# Patient Record
Sex: Male | Born: 1995 | Race: White | Hispanic: No | Marital: Single | Smoking: Never smoker
Health system: Southern US, Community
[De-identification: ages and names within clinical notes are randomized; demographics above are authoritative.]

## PROBLEM LIST (undated history)

## (undated) DIAGNOSIS — J45909 Unspecified asthma, uncomplicated: Secondary | ICD-10-CM

## (undated) HISTORY — DX: Unspecified asthma, uncomplicated: J45.909

---
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
## 2019-07-15 ENCOUNTER — Other Ambulatory Visit: Payer: Self-pay

## 2019-07-15 ENCOUNTER — Ambulatory Visit: Payer: BC Managed Care – PPO | Attending: Internal Medicine

## 2019-07-15 DIAGNOSIS — Z20822 Contact with and (suspected) exposure to covid-19: Secondary | ICD-10-CM

## 2019-07-17 ENCOUNTER — Telehealth: Payer: Self-pay

## 2019-07-17 LAB — NOVEL CORONAVIRUS, NAA: SARS-CoV-2, NAA: NOT DETECTED

## 2019-07-17 NOTE — Telephone Encounter (Signed)
Pt. Checking on COVID 19 results, not available yet. °

## 2019-07-30 ENCOUNTER — Other Ambulatory Visit: Payer: Self-pay

## 2019-07-30 ENCOUNTER — Ambulatory Visit: Payer: BC Managed Care – PPO | Attending: Internal Medicine

## 2019-07-30 DIAGNOSIS — Z20822 Contact with and (suspected) exposure to covid-19: Secondary | ICD-10-CM

## 2019-08-01 LAB — NOVEL CORONAVIRUS, NAA: SARS-CoV-2, NAA: DETECTED — AB

## 2021-03-21 ENCOUNTER — Emergency Department (HOSPITAL_COMMUNITY)
Admission: EM | Admit: 2021-03-21 | Discharge: 2021-03-22 | Disposition: A | Discharge status: Home / Self Care | Payer: BC Managed Care – PPO | Attending: Emergency Medicine | Admitting: Emergency Medicine

## 2021-03-21 ENCOUNTER — Other Ambulatory Visit: Payer: Self-pay

## 2021-03-21 ENCOUNTER — Emergency Department (HOSPITAL_COMMUNITY): Payer: BC Managed Care – PPO

## 2021-03-21 ENCOUNTER — Encounter (HOSPITAL_COMMUNITY): Payer: Self-pay | Admitting: Emergency Medicine

## 2021-03-21 DIAGNOSIS — M542 Cervicalgia: Secondary | ICD-10-CM | POA: Diagnosis not present

## 2021-03-21 DIAGNOSIS — M79602 Pain in left arm: Secondary | ICD-10-CM | POA: Diagnosis not present

## 2021-03-21 DIAGNOSIS — Z87891 Personal history of nicotine dependence: Secondary | ICD-10-CM | POA: Insufficient documentation

## 2021-03-21 DIAGNOSIS — J45909 Unspecified asthma, uncomplicated: Secondary | ICD-10-CM | POA: Diagnosis not present

## 2021-03-21 DIAGNOSIS — R0789 Other chest pain: Secondary | ICD-10-CM

## 2021-03-21 LAB — BASIC METABOLIC PANEL
Anion gap: 9 (ref 5–15)
BUN: 17 mg/dL (ref 6–20)
CO2: 25 mmol/L (ref 22–32)
Calcium: 9.3 mg/dL (ref 8.9–10.3)
Chloride: 103 mmol/L (ref 98–111)
Creatinine, Ser: 0.89 mg/dL (ref 0.61–1.24)
GFR, Estimated: >60 mL/min (ref >60)
Glucose, Bld: 105 mg/dL — ABNORMAL HIGH (ref 70–99)
Potassium: 3.7 mmol/L (ref 3.5–5.1)
Sodium: 137 mmol/L (ref 135–145)

## 2021-03-21 LAB — CBC
HCT: 44.4 % (ref 39.0–52.0)
Hemoglobin: 16.4 g/dL (ref 13.0–17.0)
MCH: 33.5 pg (ref 26.0–34.0)
MCHC: 36.9 g/dL — ABNORMAL HIGH (ref 30.0–36.0)
MCV: 90.6 fL (ref 80.0–100.0)
Platelets: 267 10*3/uL (ref 150–400)
RBC: 4.9 MIL/uL (ref 4.22–5.81)
RDW: 11.5 % (ref 11.5–15.5)
WBC: 8 10*3/uL (ref 4.0–10.5)
nRBC: 0 % (ref 0.0–0.2)

## 2021-03-21 LAB — TROPONIN I (HIGH SENSITIVITY)
Troponin I (High Sensitivity): <2 ng/L (ref <18)
Troponin I (High Sensitivity): <2 ng/L (ref <18)

## 2021-03-21 LAB — CK: Total CK: 312 U/L (ref 49–397)

## 2021-03-21 LAB — D-DIMER, QUANTITATIVE: D-Dimer, Quant: <0.27 ug/mL-FEU (ref 0.00–0.50)

## 2021-03-21 MED ORDER — KETOROLAC TROMETHAMINE 30 MG/ML IJ SOLN
30.0000 mg | Freq: Once | INTRAMUSCULAR | Status: AC
  Administered 2021-03-21: 30 mg via INTRAMUSCULAR
  Filled 2021-03-21: qty 1

## 2021-03-21 NOTE — ED Triage Notes (Signed)
Pt c/o left chest pain that radiates down left arm for a few days.

## 2021-03-21 NOTE — ED Provider Notes (Signed)
[facility] EMERGENCY DEPARTMENT Provider Note   CSN: [id number] Arrival date & time: 03/21/21  1913     History Chief Complaint  Patient presents with   Chest Pain    Jeremiah Robinson is a 25 y.o. male.  Patient with a history of asthma presenting with a 3-day history of left-sided chest, arm and neck pain.  He denies any fall or trauma.  States the pain is fairly constant and has not tried anything for it at home.  He was initially having some chest pain but is now having more arm pain and also having a chest pain as well.  Describes pain in his chest that radiates into his left upper arm that is constant.  Does not radiate to his back.  There is no associated weakness in the arm.  No numbness or tingling.  No cough or fever.  No vomiting or diaphoresis.  Denies any cardiac history or any family history of early cardiac death.  Uses marijuana but no other illicit drugs.  Prescribed no regular medications. The pain is not exertional or pleuritic.  Is not worse with palpation or movement.  He is not certain what is causing it.  Today he is having pain mostly in his left bicep and not so much in his chest.  Denies any headache.  No focal weakness, numbness or tingling.  Describes the pain as a "ache on the inside".  The history is provided by the patient.  Chest Pain Associated symptoms: no abdominal pain, no dizziness, no fever, no headache, no nausea, no shortness of breath, no vomiting and no weakness       Past Medical History:  Diagnosis Date   Asthma    Has not needed inhaler in a few years    Patient Active Problem List   Diagnosis Date Noted   Exercise-induced asthma 08/09/2014    History reviewed. No pertinent surgical history.     No family history on file.  Social History   Tobacco Use   Smoking status: Never   Smokeless tobacco: Former    Types: Snuff  Substance Use Topics   Alcohol use: No   Drug use: Yes    Types: Marijuana    Home Medications Prior  to Admission medications   Medication Sig Start Date End Date Taking? Authorizing Provider  albuterol (PROAIR HFA) 108 (90 BASE) MCG/ACT inhaler Inhale 2 puffs into the lungs every 6 (six) hours as needed for wheezing. Patient not taking: No sig reported 08/09/14   Arnaldo Natal, MD    Allergies    Penicillins  Review of Systems   Review of Systems  Constitutional:  Negative for activity change, appetite change and fever.  HENT:  Negative for congestion and rhinorrhea.   Eyes:  Negative for visual disturbance.  Respiratory:  Positive for chest tightness. Negative for shortness of breath.   Cardiovascular:  Positive for chest pain. Negative for leg swelling.  Gastrointestinal:  Negative for abdominal pain, nausea and vomiting.  Genitourinary:  Negative for dysuria and hematuria.  Musculoskeletal:  Positive for arthralgias and myalgias.  Neurological:  Negative for dizziness, weakness and headaches.   all other systems are negative except as noted in the HPI and PMH.   Physical Exam Updated Vital Signs BP 133/81   Pulse (!) 53   Temp 98.9 F (37.2 C) (Oral)   Resp 14   Ht 5\' 11"  (1.803 m)   Wt 74.8 kg   SpO2 99%   BMI 23.01 kg/m  Physical Exam Vitals and nursing note reviewed.  Constitutional:      General: He is not in acute distress.    Appearance: He is well-developed.  HENT:     Head: Normocephalic and atraumatic.     Mouth/Throat:     Pharynx: No oropharyngeal exudate.  Eyes:     Conjunctiva/sclera: Conjunctivae normal.     Pupils: Pupils are equal, round, and reactive to light.  Neck:     Comments: No meningismus. Cardiovascular:     Rate and Rhythm: Normal rate and regular rhythm.     Heart sounds: Normal heart sounds. No murmur heard. Pulmonary:     Effort: Pulmonary effort is normal. No respiratory distress.     Breath sounds: Normal breath sounds.  Chest:     Chest wall: No tenderness.  Abdominal:     Palpations: Abdomen is soft.     Tenderness: There  is no abdominal tenderness. There is no guarding or rebound.  Musculoskeletal:        General: Tenderness present. Normal range of motion.     Cervical back: Normal range of motion and neck supple.     Comments: Tenderness to left bicep without erythema or edema. Equal grip strength bilaterally.  Full range of motion of left shoulder, elbow and wrist.  Skin:    General: Skin is warm.  Neurological:     Mental Status: He is alert and oriented to person, place, and time.     Cranial Nerves: No cranial nerve deficit.     Motor: No abnormal muscle tone.     Coordination: Coordination normal.     Comments:  5/5 strength throughout. CN 2-12 intact.Equal grip strength.   Psychiatric:        Behavior: Behavior normal.    ED Results / Procedures / Treatments   Labs (all labs ordered are listed, but only abnormal results are displayed) Labs Reviewed  BASIC METABOLIC PANEL - Abnormal; Notable for the following components:      Result Value   Glucose, Bld 105 (*)    All other components within normal limits  CBC - Abnormal; Notable for the following components:   MCHC 36.9 (*)    All other components within normal limits  D-DIMER, QUANTITATIVE  CK  TROPONIN I (HIGH SENSITIVITY)  TROPONIN I (HIGH SENSITIVITY)    EKG EKG Interpretation  Date/Time:  Tuesday March 21 2021 19:36:54 EDT Ventricular Rate:  77 PR Interval:  164 QRS Duration: 96 QT Interval:  374 QTC Calculation: 423 R Axis:   72 Text Interpretation: Normal sinus rhythm with sinus arrhythmia Normal ECG No previous ECGs available Confirmed by Glynn Octave [phone]) on 03/21/2021 10:13:23 PM  Radiology DG Chest 2 View  Result Date: 03/21/2021 CLINICAL DATA:  Chest pain EXAM: CHEST - 2 VIEW COMPARISON:  07/05/2006 FINDINGS: The heart size and mediastinal contours are within normal limits. Both lungs are clear. The visualized skeletal structures are unremarkable. IMPRESSION: No active cardiopulmonary disease.  Electronically Signed   By: Helyn Numbers M.D.   On: 03/21/2021 20:03    Procedures Procedures   Medications Ordered in ED Medications  ketorolac (TORADOL) 30 MG/ML injection 30 mg (30 mg Intramuscular Given 03/21/21 2311)    ED Course  I have reviewed the triage vital signs and the nursing notes.  Pertinent labs & imaging results that were available during my care of the patient were reviewed by me and considered in my medical decision making (see chart for details).  MDM Rules/Calculators/A&P                           3 days of left-sided chest pain and upper arm pain.  EKG is sinus rhythm without acute ischemia.  Neurovascularly intact.  Low suspicion for ACS or PE.  Troponin is negative, D-dimer is negative, CK is normal  Somewhat improved with Toradol.  Troponin negative x2.  Low suspicion for ACS or pulmonary embolism.  Mother at bedside reports patient has fractured his left clavicle in the past and had intermittent numbness and pain in his left arm but never lasting like this.  He has good strength and sensation to his arms currently.  Question possible cervical radiculopathy.  We will treat with steroids and muscle relaxers.  No weakness or numbness currently. No indication for emergent MRI.  Advise follow-up with PCP for possible MRI if symptoms persist.  Low suspicion for cardiac cause of chest pain Return to the ED if weakness in the arm, exertional chest pain, shortness of breath, nausea, vomiting, sweating, any other concerns. Final Clinical Impression(s) / ED Diagnoses Final diagnoses:  Atypical chest pain  Pain of left upper extremity    Rx / DC Orders ED Discharge Orders     None        Bettylee Feig, Jeannett Senior, MD 03/22/21 [phone]

## 2021-03-22 MED ORDER — METHOCARBAMOL 500 MG PO TABS: 500.0000 mg | ORAL_TABLET | Freq: Three times a day (TID) | ORAL | 0 refills | Status: DC | PRN

## 2021-03-22 MED ORDER — METHYLPREDNISOLONE 4 MG PO TBPK: ORAL_TABLET | ORAL | 0 refills | Status: DC

## 2021-03-22 NOTE — Discharge Instructions (Addendum)
There is no evidence of heart attack or blood clot in the lung.  Take the anti-inflammatories and muscle relaxers as prescribed.  Follow-up with your doctor for possible MRI of your neck if your symptoms persist.  Return to the ED with exertional chest pain, shortness of breath, weakness, numbness, tingling in the arm or any other concerns

## 2021-03-24 ENCOUNTER — Encounter (HOSPITAL_COMMUNITY): Payer: Self-pay | Admitting: *Deleted

## 2021-03-24 ENCOUNTER — Emergency Department (HOSPITAL_COMMUNITY)
Admission: EM | Admit: 2021-03-24 | Discharge: 2021-03-24 | Disposition: A | Discharge status: Home / Self Care | Payer: BC Managed Care – PPO | Attending: Emergency Medicine | Admitting: Emergency Medicine

## 2021-03-24 ENCOUNTER — Other Ambulatory Visit: Payer: Self-pay

## 2021-03-24 DIAGNOSIS — M549 Dorsalgia, unspecified: Secondary | ICD-10-CM | POA: Diagnosis not present

## 2021-03-24 DIAGNOSIS — R0602 Shortness of breath: Secondary | ICD-10-CM | POA: Diagnosis not present

## 2021-03-24 DIAGNOSIS — R0789 Other chest pain: Secondary | ICD-10-CM | POA: Insufficient documentation

## 2021-03-24 DIAGNOSIS — J45909 Unspecified asthma, uncomplicated: Secondary | ICD-10-CM | POA: Insufficient documentation

## 2021-03-24 LAB — TROPONIN I (HIGH SENSITIVITY): Troponin I (High Sensitivity): <2 ng/L (ref <18)

## 2021-03-24 MED ORDER — HYDROCODONE-ACETAMINOPHEN 5-325 MG PO TABS
1.0000 | ORAL_TABLET | Freq: Once | ORAL | Status: AC
Start: 2021-03-24 — End: 2021-03-24
  Administered 2021-03-24: 1 via ORAL
  Filled 2021-03-24: qty 1

## 2021-03-24 NOTE — ED Notes (Signed)
Provider in room  

## 2021-03-24 NOTE — Discharge Instructions (Addendum)

## 2021-03-24 NOTE — ED Notes (Signed)
Pt in room with dad, pt reports vague bilateral shoulder pain/back and was seen here 3 days ago for same.

## 2021-03-24 NOTE — ED Triage Notes (Signed)
Pain in shoulders radiating into back area, seen 3 days ago for same

## 2021-03-24 NOTE — ED Provider Notes (Signed)
[facility] EMERGENCY DEPARTMENT Provider Note   CSN: [id number] Arrival date & time: 03/24/21  1809     History No chief complaint on file.   Jeremiah Robinson is a 25 y.o. male.  HPI  25 year old male presents the emergency department today for evaluation of left upper back pain and chest pain.  He was seen for similar symptoms yesterday and had a negative work-up including negative troponins, D-dimer, CK, EKG and chest x-ray.  He states that he was given medications for his symptoms she has been taking with no relief.  He denies any specific exacerbating or alleviating factors.  It is not worse with certain movements or exercise.  The pain is not pleuritic.  He reports some mild associated shortness of breath.  He denies any associated nausea, vomiting, abdominal pain, coughing or fevers.  He denies any known injuries.  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 No pertinent surgical history.     No family history on file.  Social History   Tobacco Use   Smoking status: Never   Smokeless tobacco: Former    Types: Snuff  Substance Use Topics   Alcohol use: Yes   Drug use: Yes    Types: Marijuana    Home Medications Prior to Admission medications   Medication Sig Start Date End Date Taking? Authorizing Provider  methocarbamol (ROBAXIN) 500 MG tablet Take 1 tablet (500 mg total) by mouth every 8 (eight) hours as needed for muscle spasms. 03/22/21  Yes Rancour, Jeannett Senior, MD  methylPREDNISolone (MEDROL DOSEPAK) 4 MG TBPK tablet As directed 03/22/21  Yes Rancour, Jeannett Senior, MD  albuterol (PROAIR HFA) 108 (90 BASE) MCG/ACT inhaler Inhale 2 puffs into the lungs every 6 (six) hours as needed for wheezing. Patient not taking: No sig reported 08/09/14   Arnaldo Natal, MD    Allergies    Penicillins  Review of Systems    Review of Systems  Constitutional:  Negative for chills and fever.  HENT:  Negative for ear pain and sore throat.   Eyes:  Negative for pain and visual disturbance.  Respiratory:  Negative for cough and shortness of breath.   Cardiovascular:  Positive for chest pain.  Gastrointestinal:  Negative for abdominal pain, constipation, diarrhea, nausea and vomiting.  Genitourinary:  Negative for dysuria and hematuria.  Musculoskeletal:  Positive for back pain.  Skin:  Negative for color change and rash.  Neurological:  Negative for headaches.  All other systems reviewed and are negative.  Physical Exam Updated Vital Signs BP 129/80 (BP Location: Right Arm)   Pulse 62   Temp 98.8 F (37.1 C)   Resp 16   SpO2 100%   Physical Exam Vitals and nursing note reviewed.  Constitutional:      Appearance: He is well-developed.  HENT:     Head: Normocephalic and atraumatic.  Eyes:     Conjunctiva/sclera: Conjunctivae normal.  Cardiovascular:     Rate and Rhythm: Normal rate and regular rhythm.     Heart sounds: Normal heart sounds. No murmur heard. Pulmonary:     Effort: Pulmonary effort is normal. No respiratory distress.     Breath sounds: Normal breath sounds. No wheezing, rhonchi or rales.  Abdominal:     General: Bowel sounds are normal.     Palpations: Abdomen  is soft.     Tenderness: There is no abdominal tenderness.  Musculoskeletal:     Cervical back: Neck supple.     Comments: Mild TTP to the left upper back and left chest wall  Skin:    General: Skin is warm and dry.  Neurological:     Mental Status: He is alert.    ED Results / Procedures / Treatments   Labs (all labs ordered are listed, but only abnormal results are displayed) Labs Reviewed  I-STAT CHEM 8, ED  TROPONIN I (HIGH SENSITIVITY)  TROPONIN I (HIGH SENSITIVITY)    EKG EKG Interpretation  Date/Time:  Friday March 24 2021 21:05:27 EDT Ventricular Rate:  59 PR Interval:  138 QRS Duration: 98 QT  Interval:  394 QTC Calculation: 391 R Axis:   69 Text Interpretation: Sinus rhythm ST elev, probable normal early repol pattern No significant change since last tracing Confirmed by Susy Frizzle [phone]) on 03/24/2021 9:14:46 PM  Radiology No results found.  Procedures Procedures   Medications Ordered in ED Medications  HYDROcodone-acetaminophen (NORCO/VICODIN) 5-325 MG per tablet 1 tablet (1 tablet Oral Given 03/24/21 2101)    ED Course  I have reviewed the triage vital signs and the nursing notes.  Pertinent labs & imaging results that were available during my care of the patient were reviewed by me and considered in my medical decision making (see chart for details).    MDM Rules/Calculators/A&P                          25 y/o m presenting for eval of chest pain and back pain. Seen for same 2 days ago and given meds which he states are not helping   Reviewed/interpreted labs Chem 8 did not result as there was a machine error. Pt and father did not want to wait for repeat testing for restuls Trop neg  Ekg neg  W/u reassuring. Pt feels improved after meds here in the ed. He is very well appearing and I have low suspicion for an emergent etiology of symptoms that would require further w/u or admission. Have advised pcp f/u and strict return precautions. He voices understanding of the plan and reasons to return. All questions answered, pt stable for discharge  Final Clinical Impression(s) / ED Diagnoses Final diagnoses:  Atypical chest pain    Rx / DC Orders ED Discharge Orders     None        Rayne Du 03/24/21 2257    Pollyann Savoy, MD 03/24/21 2320

## 2022-03-01 IMAGING — DX DG CERVICAL SPINE COMPLETE 4+V
5 series · 5 of 5 positions shown · non-contrast
Comparison: None.

CLINICAL DATA: Left upper extremity pain

EXAM:
CERVICAL SPINE - COMPLETE 4+ VIEW

[c-spine lat]
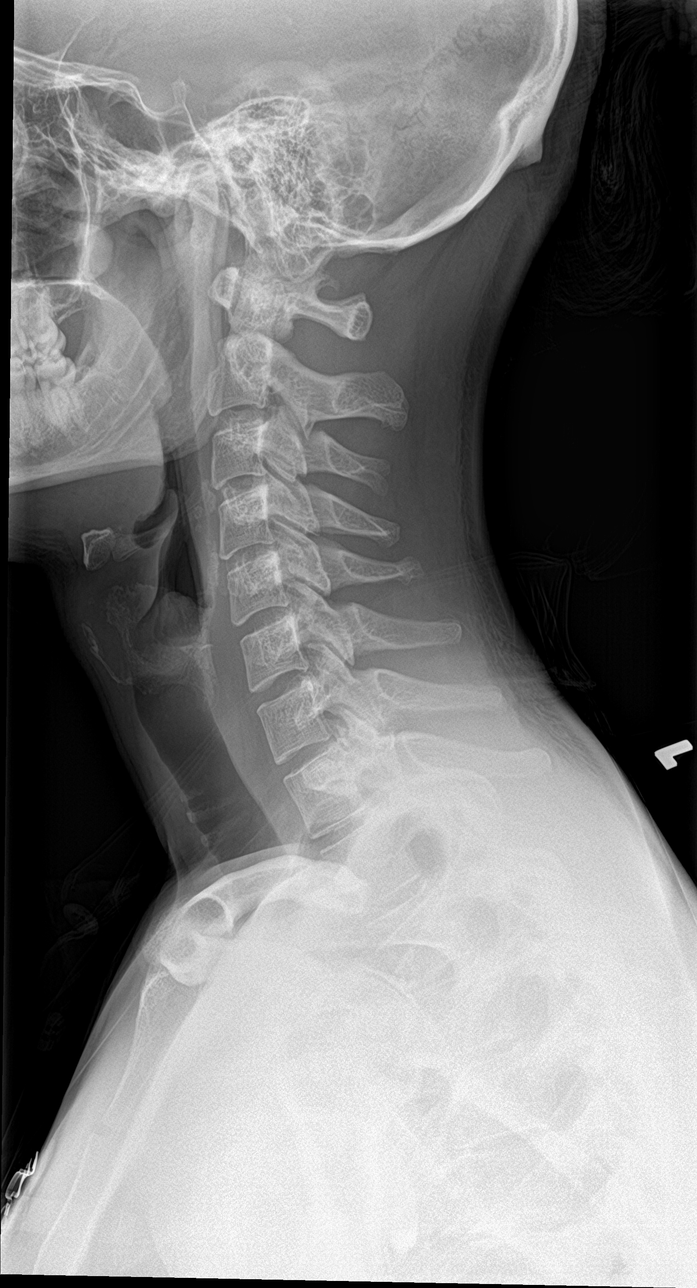

[c-spine obl (1 of 2)]
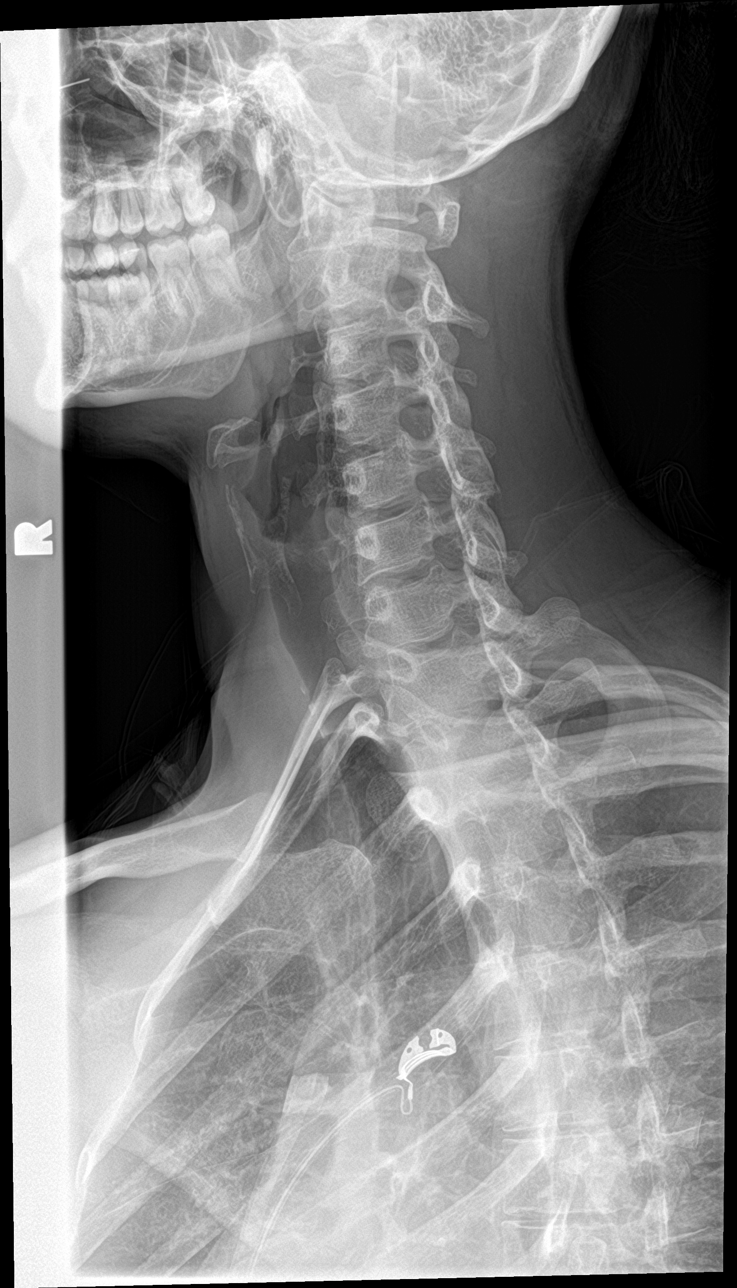

[c-spine obl (2 of 2)]
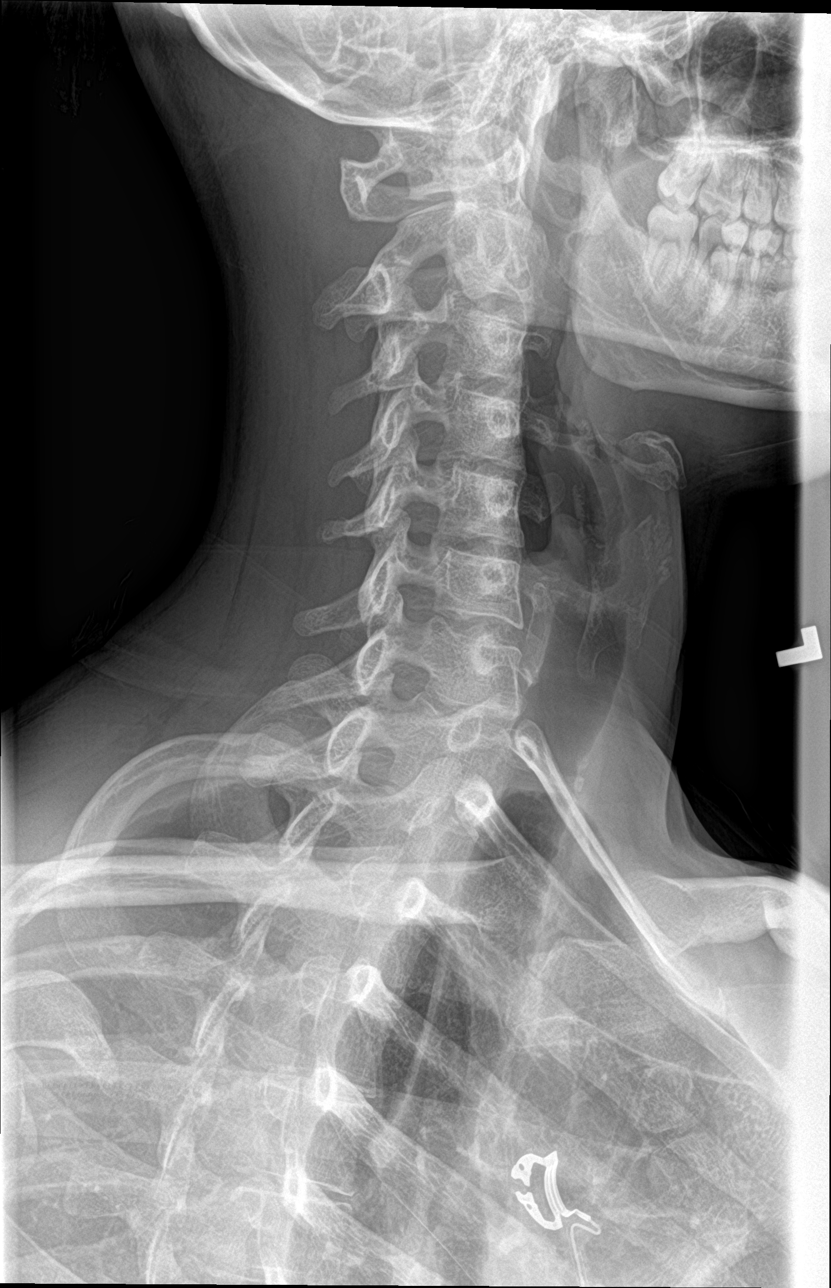

[c-spine ap]
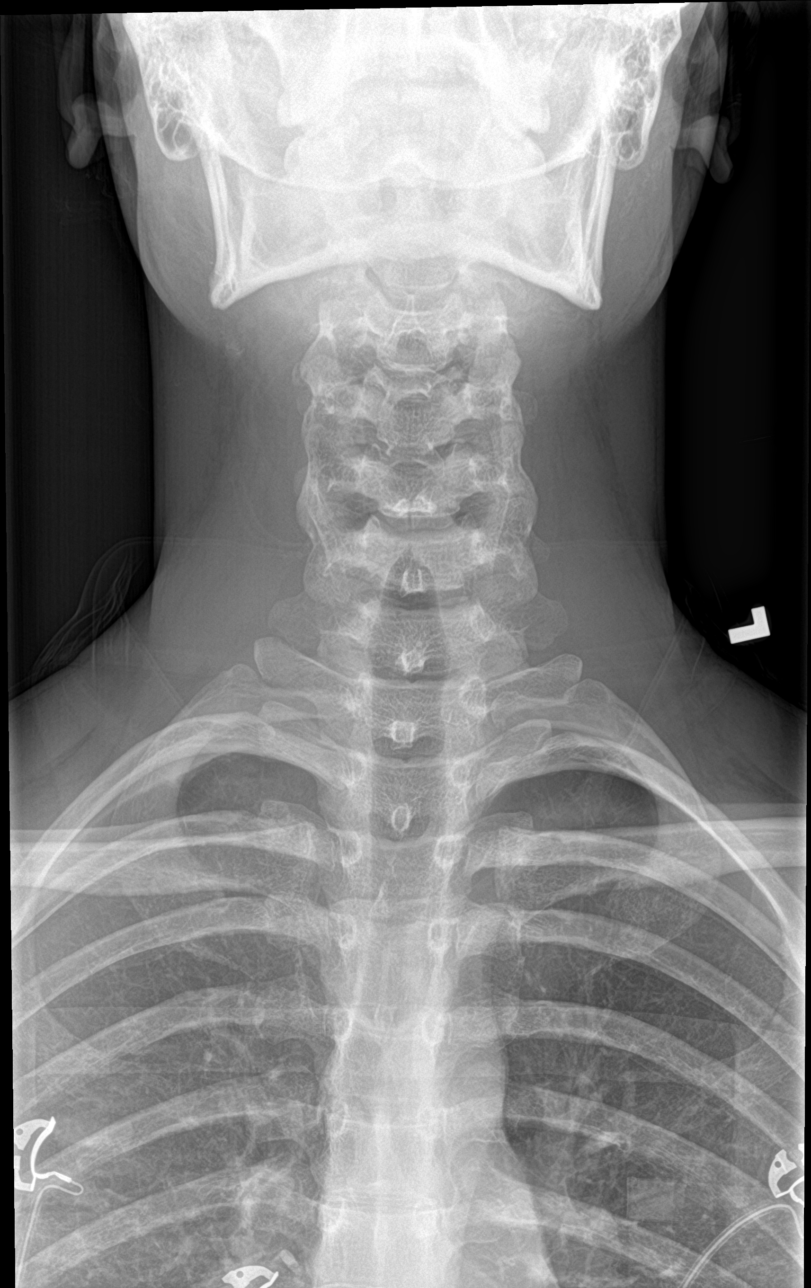

[c-spine open mouth]
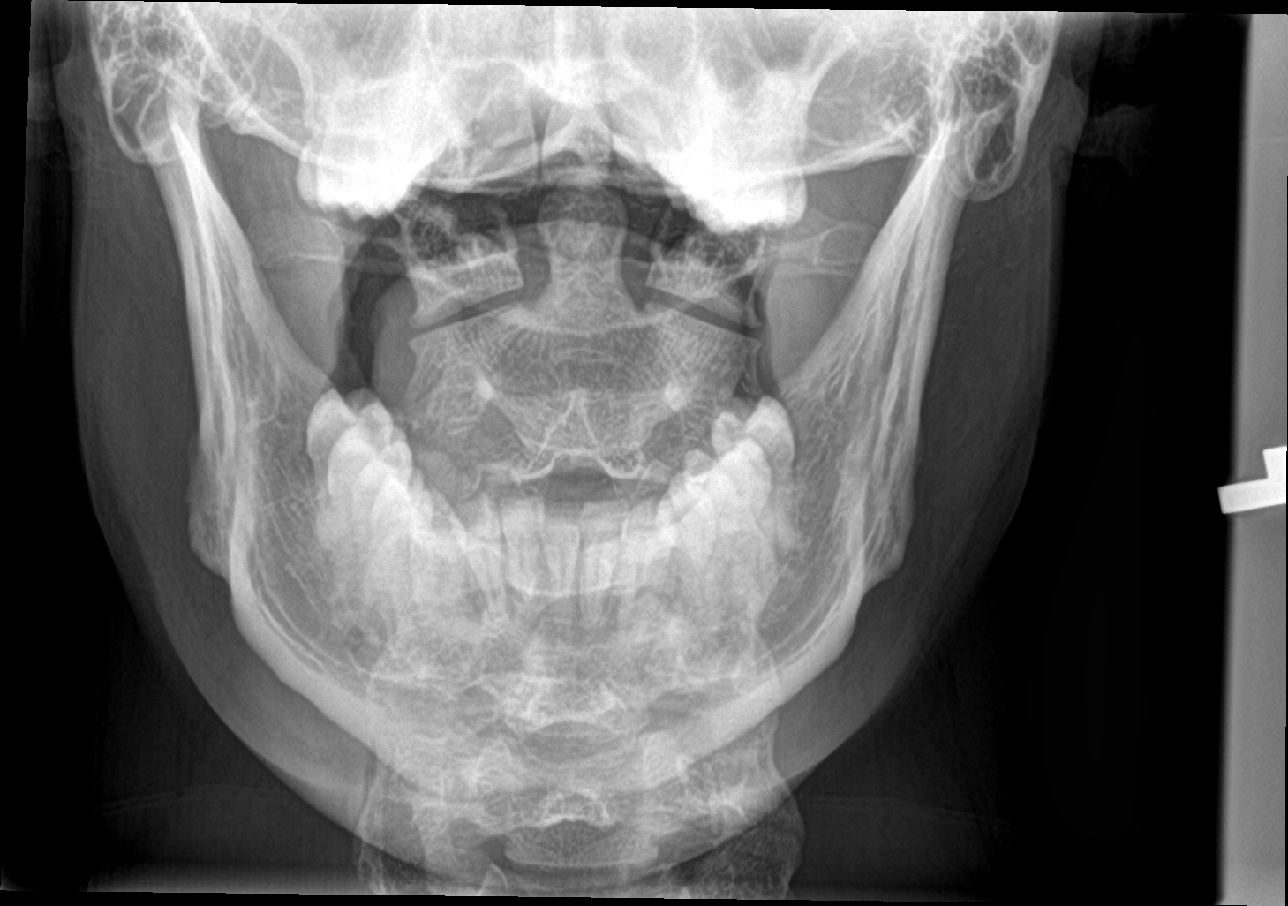

[5 of 5 positions shown; findings below may reference images not displayed]

FINDINGS: There is no evidence of cervical spine fracture or prevertebral soft
tissue swelling. Alignment is normal. No other significant bone
abnormalities are identified.
IMPRESSION: Negative cervical spine radiographs.

## 2022-11-27 ENCOUNTER — Emergency Department (HOSPITAL_COMMUNITY): Payer: BC Managed Care – PPO

## 2022-11-27 ENCOUNTER — Encounter (HOSPITAL_COMMUNITY): Payer: Self-pay | Admitting: Emergency Medicine

## 2022-11-27 ENCOUNTER — Other Ambulatory Visit: Payer: Self-pay

## 2022-11-27 ENCOUNTER — Emergency Department (HOSPITAL_COMMUNITY)
Admission: EM | Admit: 2022-11-27 | Discharge: 2022-11-27 | Disposition: A | Discharge status: Home / Self Care | Payer: BC Managed Care – PPO | Attending: Emergency Medicine | Admitting: Emergency Medicine

## 2022-11-27 DIAGNOSIS — I10 Essential (primary) hypertension: Secondary | ICD-10-CM | POA: Insufficient documentation

## 2022-11-27 DIAGNOSIS — N50812 Left testicular pain: Secondary | ICD-10-CM

## 2022-11-27 LAB — URINALYSIS, ROUTINE W REFLEX MICROSCOPIC
Bilirubin Urine: NEGATIVE
Glucose, UA: NEGATIVE mg/dL
Hgb urine dipstick: NEGATIVE
Ketones, ur: NEGATIVE mg/dL
Leukocytes,Ua: NEGATIVE
Nitrite: NEGATIVE
Protein, ur: NEGATIVE mg/dL
Specific Gravity, Urine: 1.019 (ref 1.005–1.030)
pH: 7 (ref 5.0–8.0)

## 2022-11-27 MED ORDER — NAPROXEN 500 MG PO TABS: 500.0000 mg | ORAL_TABLET | Freq: Two times a day (BID) | ORAL | 0 refills | Status: AC

## 2022-11-27 NOTE — ED Provider Notes (Signed)
[place] EMERGENCY DEPARTMENT AT [facility] Provider Note   CSN: [id number] Arrival date & time: 11/27/22  1857     History  Chief Complaint  Patient presents with   Testicle Pain    Jeremiah Robinson is a 27 y.o. male.   Testicle Pain   This patient is a 27 year old male who denies any chronic medical conditions presenting to the hospital after developing left-sided testicle pain which started this morning about 12 hours ago.  This has been persistent throughout the day and has been quite intense at times.  He denies any swelling redness or fever, he has no dysuria urethral discharge or abdominal pain and no nausea or vomiting.  He remembers having something similar about 4 weeks ago when he was visiting [place], it was quite intense at that time but seem to resolve and went away by itself.  He has not had it again until today.  No injuries or trauma    Home Medications Prior to Admission medications   Medication Sig Start Date End Date Taking? Authorizing Provider  naproxen (NAPROSYN) 500 MG tablet Take 1 tablet (500 mg total) by mouth 2 (two) times daily with a meal. 11/27/22  Yes Eber Hong, MD      Allergies    Penicillins    Review of Systems   Review of Systems  Genitourinary:  Positive for testicular pain.    Physical Exam Updated Vital Signs BP (!) 157/77 (BP Location: Right Arm)   Pulse 72   Temp 98.3 F (36.8 C) (Oral)   Resp 18   Ht 1.803 m (5\' 11" )   Wt 93 kg   SpO2 98%   BMI 28.59 kg/m  Physical Exam Vitals and nursing note reviewed.  Constitutional:      Appearance: He is well-developed. He is not diaphoretic.  HENT:     Head: Normocephalic and atraumatic.  Eyes:     General:        Right eye: No discharge.        Left eye: No discharge.     Conjunctiva/sclera: Conjunctivae normal.  Pulmonary:     Effort: Pulmonary effort is normal. No respiratory distress.  Genitourinary:    Comments: Normal-appearing penis scrotum and  testicles other than tenderness of the left testicle.  It does not have a normal rise with cremasteric reflex compared to the right but does not have any focal swelling or redness, it appears to have a normal lie in the scrotum. Skin:    General: Skin is warm and dry.     Findings: No erythema or rash.  Neurological:     Mental Status: He is alert.     Coordination: Coordination normal.     ED Results / Procedures / Treatments   Labs (all labs ordered are listed, but only abnormal results are displayed) Labs Reviewed  URINALYSIS, ROUTINE W REFLEX MICROSCOPIC  GC/CHLAMYDIA PROBE AMP ([place]) NOT AT [facility]    EKG None  Radiology US SCROTUM W/DOPPLER  Result Date: 11/27/2022 CLINICAL DATA:  Left testicle pain EXAM: SCROTAL ULTRASOUND DOPPLER ULTRASOUND OF THE TESTICLES TECHNIQUE: Complete ultrasound examination of the testicles, epididymis, and other scrotal structures was performed. Color and spectral Doppler ultrasound were also utilized to evaluate blood flow to the testicles. COMPARISON:  None Available. FINDINGS: Right testicle Measurements: 5 x 2.3 x 3.3 cm. Negative for mass. Minimal microlithiasis. Left testicle Measurements: 5.1 x 2.3 x 3.1 cm. No mass or microlithiasis visualized. Right epididymis:  Cyst  measuring 5 mm Left epididymis:  Cyst measuring 11 mm Hydrocele:  Trace right hydrocele Varicocele:  None visualized. Pulsed Doppler interrogation of both testes demonstrates normal low resistance arterial and venous waveforms bilaterally. IMPRESSION: 1. Negative for testicular mass or torsion. 2. Small bilateral epididymal cysts. Trace right hydrocele. 3. Minimal right testicular microlithiasis. Current literature suggests that testicular microlithiasis is not a significant independent risk factor for development of testicular carcinoma, and that follow up imaging is not warranted in the absence of other risk factors. Monthly testicular self-examination and annual physical exams  are considered appropriate surveillance. If patient has other risk factors for testicular carcinoma, then referral to Urology should be considered. (Reference: A 5-Year Follow up Study of Asymptomatic Men with Testicular Microlithiasis. Electronically Signed   By: Jasmine Pang M.D.   On: 11/27/2022 21:04    Procedures Procedures    Medications Ordered in ED Medications - No data to display  ED Course/ Medical Decision Making/ A&P                             Medical Decision Making Amount and/or Complexity of Data Reviewed Labs: ordered. Radiology: ordered.  Risk Prescription drug management.   This will need a formal ultrasound to rule out testicular torsion.  Will also get a urinalysis and STD testing.  Vital signs reflect mild hypertension but no other acute findings.  Patient agreeable, testicular ultrasound ordered, ultrasound technician will need to be called in for an emergency ultrasound as they have already gone home for the day  [place] neg - no signs of torsion.  VS w/out acute findings.  I have discussed with the patient at the bedside the results, and the meaning of these results.  They have expressed her understanding to the need for follow-up with primary care physician        Final Clinical Impression(s) / ED Diagnoses Final diagnoses:  Pain in left testicle    Rx / DC Orders ED Discharge Orders          Ordered    naproxen (NAPROSYN) 500 MG tablet  2 times daily with meals        11/27/22 2128              Eber Hong, MD 11/27/22 2129

## 2022-11-27 NOTE — ED Triage Notes (Signed)
Pt c/o left testicle pain since 0730 this AM. States that the pain has been constant. Denies swelling

## 2022-11-27 NOTE — ED Notes (Signed)
Pt sitting in room awaiting the results of [place]

## 2022-11-27 NOTE — ED Notes (Signed)
Pt changing and removing undergarments and putting gown on.  Denies any injuries or hits into groin area.  States she was lifting and doing splits yesteday but didn't start hurting til this am

## 2022-11-27 NOTE — Discharge Instructions (Signed)
Your ultrasound is normal - your testicular looks normal  See your doctor as needed  Ibuprofen or naprosyn for pain

## 2024-04-01 ENCOUNTER — Ambulatory Visit: Attending: Internal Medicine

## 2024-04-01 DIAGNOSIS — G8929 Other chronic pain: Secondary | ICD-10-CM | POA: Diagnosis present

## 2024-04-01 DIAGNOSIS — M25512 Pain in left shoulder: Secondary | ICD-10-CM | POA: Diagnosis present

## 2024-04-01 NOTE — Therapy (Signed)
 OUTPATIENT PHYSICAL THERAPY SHOULDER EVALUATION   Patient Name: Jeremiah Robinson MRN: [id number] DOB:09-03-1995, 28 y.o., male Today's Date: 04/01/2024  END OF SESSION:  PT End of Session - 04/01/24 0854     Visit Number 1    Number of Visits 8    Date for PT Re-Evaluation 05/29/24    PT Start Time 0850    PT Stop Time 0913    PT Time Calculation (min) 23 min    Activity Tolerance Patient tolerated treatment well    Behavior During Therapy [facility] for tasks assessed/performed          Past Medical History:  Diagnosis Date   Asthma    Has not needed inhaler in a few years   History reviewed. No pertinent surgical history. 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 He notes that it is not constant, but it has remained steady. He had never had any problems with that shoulder previously. However, he had broke his collarbone on his left side in high school.  Hand dominance: Right  PERTINENT HISTORY: Asthma  PAIN:  Are you having pain? Yes: NPRS scale: Current: 1-2/10 Best: 0/10 Worst: 6/10 Pain location: left anterior and superior shoulder Pain description: sore and aching Aggravating factors: bench press Relieving factors: none known  PRECAUTIONS: None  RED FLAGS: None   WEIGHT BEARING RESTRICTIONS: No  FALLS:  Has patient fallen in last 6 months? No  OCCUPATION: Works for [employer]; no  heavy lifting required  PLOF: Independent  PATIENT GOALS: reduced pain   NEXT MD VISIT: none scheduled  OBJECTIVE:  Note: Objective measures were completed at Evaluation unless otherwise noted.  PATIENT SURVEYS:  Quick Dash:  QUICK DASH  Please rate your ability do the following activities in the last week by selecting the number below the appropriate response.   Activities Rating  Open a tight or new jar.  1 = No difficulty   Do heavy household chores (e.g., wash walls, floors). 1 = No difficulty   Carry a shopping bag or briefcase 1 =  No difficulty   Wash your back. 1 = No difficulty   Use a knife to cut food. 1 = No difficulty   Recreational activities in which you take some force or impact through your arm, shoulder or hand (e.g., golf, hammering, tennis, etc.). 2 = Mild difficulty  During the past week, to what extent has your arm, shoulder or hand problem interfered with your normal social activities with family, friends, neighbors or groups?  2 = Slightly  During the past week, were you limited in your work or other regular daily activities as a result of your arm, shoulder or hand problem? 1 = Not limited at all  Rate the severity of the following symptoms in the last week: Arm, Shoulder, or hand pain. 2 = Mild  Rate the severity of the following symptoms in the last week: Tingling (pins and needles) in your arm, shoulder or hand. 2 = Mild  During the past week, how much difficulty have you had sleeping because of the pain in your arm, shoulder or hand?  1 = No difficulty   (A QuickDASH score may not be calculated if there is greater than 1 missing item.)  Quick Dash Disability/Symptom Score: [(sum of 15 (n) responses/11 (n)] x 25 = 9.09  Minimally Clinically Important Difference (MCID): 15-20 points  (Franchignoni, F. et al. (2013). Minimally clinically important difference of the disabilities of the arm, shoulder, and hand outcome measures (DASH) and its shortened  version (Quick DASH). Journal of Orthopaedic & Sports Physical Therapy, 44(1), 30-39)   COGNITION: Overall cognitive status: Within functional limits for tasks assessed     SENSATION: Patient reports no numbness or tingling.   POSTURE: Unremarkable  UPPER EXTREMITY ROM:   Active ROM Right eval Left eval  Shoulder flexion 170 160  Shoulder extension    Shoulder abduction 172 152  Shoulder adduction    Shoulder internal rotation To T3 To T3  Shoulder external rotation To T4 To T3  Elbow flexion    Elbow extension    Wrist flexion    Wrist extension    Wrist ulnar deviation    Wrist radial deviation    Wrist pronation    Wrist supination    (Blank rows = not tested)  UPPER EXTREMITY MMT:  MMT Right eval Left eval  Shoulder flexion 4/5 4-/5; painful and crepitus  Shoulder extension    Shoulder abduction 5/5 5/5  Shoulder adduction    Shoulder internal rotation 5/5 5/5  Shoulder external rotation 4+/5 5/5  Middle trapezius    Lower trapezius    Elbow flexion 5/5 4+/5  Elbow extension 5/5 5/5  Wrist flexion    Wrist extension    Wrist ulnar deviation    Wrist radial deviation    Wrist pronation    Wrist supination    Grip strength (lbs) 100 100  (Blank rows = not tested)   PALPATION:  Familiar pain reproduced with palpation to left upper trapezius and long head of the biceps  TREATMENT DATE:    PATIENT EDUCATION: Education details: POC, objective findings, prognosis, and goals for physical therapy Person educated: Patient Education method: Explanation Education comprehension: verbalized understanding  HOME EXERCISE PROGRAM:   ASSESSMENT:  CLINICAL IMPRESSION: Patient is a 28 y.o. male who was seen today for physical therapy evaluation and treatment for chronic left shoulder pain. He presented with low pain severity and  irritability with left shoulder flexion strength testing and palpation to his left upper trapezius and long head of the biceps reproducing his familiar symptoms. Recommend that he continue with skilled physical therapy to address his impairments to return to his prior level of function.    OBJECTIVE IMPAIRMENTS: decreased activity tolerance, decreased ROM, decreased strength, impaired tone, impaired UE functional use, and pain.   ACTIVITY LIMITATIONS: lifting  PARTICIPATION LIMITATIONS: community activity  PERSONAL FACTORS: Time since onset of injury/illness/exacerbation and 1 comorbidity: asthma are also affecting patient's functional outcome.   REHAB POTENTIAL: Good  CLINICAL DECISION MAKING: Stable/uncomplicated  EVALUATION COMPLEXITY: Low   GOALS: Goals reviewed with patient? Yes  LONG TERM GOALS: Target date: 04/29/24  Patient will be independent with his HEP.  Baseline:  Goal status: INITIAL  2.  Patient will be able to complete his daily activities without his familiar symptoms exceeding 3/10. Baseline:  Goal status: INITIAL  3.  Patient will be able to complete his gym routine without being limited by his left shoulder symptoms to reduce his risk of reinjury.  Baseline:  Goal status: INITIAL  4.  Patient will improve his left shoulder flexion strength to at least 4+/5 for improved function lifting.  Baseline:  Goal status: INITIAL  PLAN:  PT FREQUENCY: 1-2x/week  PT DURATION: 4 weeks  PLANNED INTERVENTIONS: 97164- PT Re-evaluation, 97750- Physical Performance Testing, 97110-Therapeutic exercises, 97530- Therapeutic activity, [id number]- Neuromuscular re-education, 97535- Self Care, 02859- Manual therapy, [id number]- Electrical stimulation (unattended), 97016- Vasopneumatic device, 20560 (1-2 muscles), 20561 (3+ muscles)- Dry Needling, Patient/Family education, Taping, Joint mobilization, Cryotherapy, and Moist heat  PLAN FOR NEXT SESSION: UBE, manual therapy, assess for dry  needling, and modalities as needed   Lacinda JAYSON Fass, PT 04/01/2024, 10:07 AM

## 2024-04-09 ENCOUNTER — Encounter
